# Patient Record
Sex: Male | Born: 1995 | Race: White | Hispanic: No | Marital: Single | Smoking: Current every day smoker
Health system: Southern US, Community
[De-identification: ages and names within clinical notes are randomized; demographics above are authoritative.]

## PROBLEM LIST (undated history)

## (undated) DIAGNOSIS — R569 Unspecified convulsions: Secondary | ICD-10-CM

---

## 2014-11-25 ENCOUNTER — Encounter (HOSPITAL_COMMUNITY): Payer: Self-pay | Admitting: Emergency Medicine

## 2014-11-25 ENCOUNTER — Emergency Department (HOSPITAL_COMMUNITY)
Admission: EM | Admit: 2014-11-25 | Discharge: 2014-11-25 | Disposition: A | Discharge status: Home / Self Care | Payer: Medicaid Other | Attending: Emergency Medicine | Admitting: Emergency Medicine

## 2014-11-25 DIAGNOSIS — J069 Acute upper respiratory infection, unspecified: Secondary | ICD-10-CM | POA: Diagnosis not present

## 2014-11-25 DIAGNOSIS — R110 Nausea: Secondary | ICD-10-CM | POA: Insufficient documentation

## 2014-11-25 DIAGNOSIS — B9789 Other viral agents as the cause of diseases classified elsewhere: Secondary | ICD-10-CM

## 2014-11-25 DIAGNOSIS — R0981 Nasal congestion: Secondary | ICD-10-CM | POA: Diagnosis present

## 2014-11-25 MED ORDER — ALBUTEROL SULFATE HFA 108 (90 BASE) MCG/ACT IN AERS: 1.0000 | INHALATION_SPRAY | Freq: Four times a day (QID) | RESPIRATORY_TRACT | Status: AC | PRN

## 2014-11-25 MED ORDER — BENZONATATE 100 MG PO CAPS: 100.0000 mg | ORAL_CAPSULE | Freq: Three times a day (TID) | ORAL | Status: AC

## 2014-11-25 MED ORDER — ONDANSETRON HCL 4 MG PO TABS: 4.0000 mg | ORAL_TABLET | Freq: Four times a day (QID) | ORAL | Status: AC

## 2014-11-25 MED ORDER — FLUTICASONE PROPIONATE 50 MCG/ACT NA SUSP: 2.0000 | Freq: Every day | NASAL | Status: AC

## 2014-11-25 MED ORDER — CETIRIZINE HCL 10 MG PO CAPS: 10.0000 mg | ORAL_CAPSULE | Freq: Every day | ORAL | Status: AC

## 2014-11-25 NOTE — ED Notes (Signed)
Pt reports nasal congestion and productive cough x 3 days. Denies fevers/chills.

## 2014-11-25 NOTE — Discharge Instructions (Signed)
Upper Respiratory Infection, Adult °An upper respiratory infection (URI) is also sometimes known as the common cold. The upper respiratory tract includes the nose, sinuses, throat, trachea, and bronchi. Bronchi are the airways leading to the lungs. Most people improve within 1 week, but symptoms can last up to 2 weeks. A residual cough may last even longer.  °CAUSES °Many different viruses can infect the tissues lining the upper respiratory tract. The tissues become irritated and inflamed and often become very moist. Mucus production is also common. A cold is contagious. You can easily spread the virus to others by oral contact. This includes kissing, sharing a glass, coughing, or sneezing. Touching your mouth or nose and then touching a surface, which is then touched by another person, can also spread the virus. °SYMPTOMS  °Symptoms typically develop 1 to 3 days after you come in contact with a cold virus. Symptoms vary from person to person. They may include: °· Runny nose. °· Sneezing. °· Nasal congestion. °· Sinus irritation. °· Sore throat. °· Loss of voice (laryngitis). °· Cough. °· Fatigue. °· Muscle aches. °· Loss of appetite. °· Headache. °· Low-grade fever. °DIAGNOSIS  °You might diagnose your own cold based on familiar symptoms, since most people get a cold 2 to 3 times a year. Your caregiver can confirm this based on your exam. Most importantly, your caregiver can check that your symptoms are not due to another disease such as strep throat, sinusitis, pneumonia, asthma, or epiglottitis. Blood tests, throat tests, and X-rays are not necessary to diagnose a common cold, but they may sometimes be helpful in excluding other more serious diseases. Your caregiver will decide if any further tests are required. °RISKS AND COMPLICATIONS  °You may be at risk for a more severe case of the common cold if you smoke cigarettes, have chronic heart disease (such as heart failure) or lung disease (such as asthma), or if  you have a weakened immune system. The very young and very old are also at risk for more serious infections. Bacterial sinusitis, middle ear infections, and bacterial pneumonia can complicate the common cold. The common cold can worsen asthma and chronic obstructive pulmonary disease (COPD). Sometimes, these complications can require emergency medical care and may be life-threatening. °PREVENTION  °The best way to protect against getting a cold is to practice good hygiene. Avoid oral or hand contact with people with cold symptoms. Wash your hands often if contact occurs. There is no clear evidence that vitamin C, vitamin E, echinacea, or exercise reduces the chance of developing a cold. However, it is always recommended to get plenty of rest and practice good nutrition. °TREATMENT  °Treatment is directed at relieving symptoms. There is no cure. Antibiotics are not effective, because the infection is caused by a virus, not by bacteria. Treatment may include: °· Increased fluid intake. Sports drinks offer valuable electrolytes, sugars, and fluids. °· Breathing heated mist or steam (vaporizer or shower). °· Eating chicken soup or other clear broths, and maintaining good nutrition. °· Getting plenty of rest. °· Using gargles or lozenges for comfort. °· Controlling fevers with ibuprofen or acetaminophen as directed by your caregiver. °· Increasing usage of your inhaler if you have asthma. °Zinc gel and zinc lozenges, taken in the first 24 hours of the common cold, can shorten the duration and lessen the severity of symptoms. Pain medicines may help with fever, muscle aches, and throat pain. A variety of non-prescription medicines are available to treat congestion and runny nose. Your caregiver   can make recommendations and may suggest nasal or lung inhalers for other symptoms.  °HOME CARE INSTRUCTIONS  °· Only take over-the-counter or prescription medicines for pain, discomfort, or fever as directed by your  caregiver. °· Use a warm mist humidifier or inhale steam from a shower to increase air moisture. This may keep secretions moist and make it easier to breathe. °· Drink enough water and fluids to keep your urine clear or pale yellow. °· Rest as needed. °· Return to work when your temperature has returned to normal or as your caregiver advises. You may need to stay home longer to avoid infecting others. You can also use a face mask and careful hand washing to prevent spread of the virus. °SEEK MEDICAL CARE IF:  °· After the first few days, you feel you are getting worse rather than better. °· You need your caregiver's advice about medicines to control symptoms. °· You develop chills, worsening shortness of breath, or brown or red sputum. These may be signs of pneumonia. °· You develop yellow or brown nasal discharge or pain in the face, especially when you bend forward. These may be signs of sinusitis. °· You develop a fever, swollen neck glands, pain with swallowing, or white areas in the back of your throat. These may be signs of strep throat. °SEEK IMMEDIATE MEDICAL CARE IF:  °· You have a fever. °· You develop severe or persistent headache, ear pain, sinus pain, or chest pain. °· You develop wheezing, a prolonged cough, cough up blood, or have a change in your usual mucus (if you have chronic lung disease). °· You develop sore muscles or a stiff neck. °Document Released: 03/27/2001 Document Revised: 12/24/2011 Document Reviewed: 01/06/2014 °ExitCare® Patient Information ©2015 [employer]. This information is not intended to replace advice given to you by your health care provider. Make sure you discuss any questions you have with your health care provider. ° °Emergency Department Resource Guide °1) Find a Doctor and Pay Out of Pocket °Although you won't have to find out who is covered by your insurance plan, it is a good idea to ask around and get recommendations. You will then need to call the office and see if  the doctor you have chosen will accept you as a new patient and what types of options they offer for patients who are self-pay. Some doctors offer discounts or will set up payment plans for their patients who do not have insurance, but you will need to ask so you aren't surprised when you get to your appointment. ° °2) Contact Your Local Health Department °Not all health departments have doctors that can see patients for sick visits, but many do, so it is worth a call to see if yours does. If you don't know where your local health department is, you can check in your phone book. The CDC also has a tool to help you locate your state's health department, and many state websites also have listings of all of their local health departments. ° °3) Find a Walk-in Clinic °If your illness is not likely to be very severe or complicated, you may want to try a walk in clinic. These are popping up all over the country in pharmacies, drugstores, and shopping centers. They're usually staffed by nurse practitioners or physician assistants that have been trained to treat common illnesses and complaints. They're usually fairly quick and inexpensive. However, if you have serious medical issues or chronic medical problems, these are probably not your best option. ° °  No Primary Care Doctor: °- Call [facility] at  832-8000 - they can help you locate a primary care doctor that  accepts your insurance, provides certain services, etc. °- Physician Referral Service- 1-800-533-3463 ° °Chronic Pain Problems: °Organization         Address  Phone   Notes  °[facility]  (336) 297-2271 Patients need to be referred by their primary care doctor.  ° °Medication Assistance: °Organization         Address  Phone   Notes  °[place] [facility] [address] °[place], [place] [address] (336) 641-8030 --Must be a resident of [place] °-- Must have NO insurance coverage whatsoever (no  Medicaid/ Medicare, etc.) °-- The pt. MUST have a primary care doctor that directs their care regularly and follows them in the community °  °[facility]  (866) 331-1348   °[employer]  (888) 892-1162   ° °Agencies that provide inexpensive medical care: °Organization         Address  Phone   Notes  °[facility]  (336) 832-8035   °[facility]    (336) 832-7272   °[facility] [address] °[place], [place] [address] (336) 832-4777   °[facility] [address], °[place] (336) 271-4999   °[facility]    (336) 373-0678   °[facility]    (336) 272-1050   °[facility] ° [address], [place] Phone:  (336) 832-4444, Fax:  (336) 832-4440 Hours of Operation:  9 am - 6 pm, M-F.  Also accepts Medicaid/Medicare and self-pay.  °[facility] ° [address], [place] Phone: (336) 832-3150, Fax: (336) 832-3151. Hours of Operation:  8:30 am - 5:30 pm, M-F.  Also accepts Medicaid and self-pay.  °[facility] [place] [address], [place] Phone: (336) 878-6027   °[facility] [address], [place], [place] (336)723-1848, Ext. 123 Mondays & Thursdays: 7-9 AM.  First 15 patients are seen on a first come, first serve basis. °  ° °Medicaid-accepting [place] Providers: ° °Organization         Address  Phone   Notes  °[facility] [address], [place] (336) 641-2100 Also accepts self-pay patients.  °[facility] [address], [place] ° (336) 856-9996   °[facility] [address], [place] (336) 288-8857   °[facility] [address] [place] [address], [place] (336) 299-7000   °Veita Bland [address], [place]  ° (336) 373-1557 Only accepts [place] Access Medicaid patients after they have their name applied to their card.   ° °Self-Pay (no insurance) in [place]: ° °Organization         Address  Phone   Notes  °Sickle Cell Patients, [facility] [address], [place] (336) 832-1970   °[facility] [address], [place] (336) 832-4400   °[facility] ° [address],  (336) 992-4800   °[facility]/Dr. Osei-Bonsu ° [address] [place] [address], [place] or [address], [place] (336) 841-8500 Phone number for both [place] and [place] locations is the same.  °[facility] [address] [place] [address], [place] (336) 299-0000   °  [facility] [address] [place] [address], [place] or [address] (336) 852-7530 °(336) 878-2260   °[facility] [address], [place] (336) 350-1642, phone; (336) 294-5005, fax Sees patients 1st and 3rd Saturday of every month.  Must not qualify for public or private insurance (i.e. Medicaid, Medicare, [place] Health Choice, Veterans' Benefits) • Household income should be no more than 200% of the poverty level •The clinic cannot treat you if you are pregnant or think you are pregnant • Sexually transmitted diseases are not treated at the clinic.  ° ° °Dental Care: °Organization         Address  Phone  Notes  °[facility] [address], [place] (336) 641-6152 Accepts children up to age 21 who are enrolled in Medicaid or [place] Health Choice; pregnant women with a Medicaid card; and children who have applied for Medicaid or [place] Health Choice, but were declined, whose parents can pay a reduced fee at time of service.  °[facility] [place]  [address], [place] (336) 641-7733 Accepts children up to age 21 who are enrolled in Medicaid or [place] Health Choice; pregnant women with a Medicaid card; and children who have applied for Medicaid or [place] Health Choice,  but were declined, whose parents can pay a reduced fee at time of service.  °[facility] ° [address], [place] (336) 641-4533 Patients are seen by appointment only. Walk-ins are not accepted. [facility] will see patients 18 years of age and older. °Monday - Tuesday (8am-5pm) °Most Wednesdays (8:30-5pm) °$30 per visit, cash only  °[facility] ° [address], [place] (336) 641-4533 Patients are seen by appointment only. Walk-ins are not accepted. [facility] will see patients 18 years of age and older. °One Wednesday Evening (Monthly: Volunteer Based).  $30 per visit, cash only  °[facility]  (919) 537-3737 for adults; Children under age 4, call Graduate Pediatric Dentistry at (919) [phone]. Children aged 4-14, please call (919) [phone] to request a pediatric application. ° Dental services are provided in all areas of dental care including fillings, crowns and bridges, complete and partial dentures, implants, gum treatment, root canals, and extractions. Preventive care is also provided. Treatment is provided to both adults and children. °Patients are selected via a lottery and there is often a waiting list. °  °[facility] [address], °[place] ° (336) 763-8833 [email or web address] °  °[facility] [address], [place], [place] (336)723-1848, Ext. 123 Second and Fourth Thursday of each month, opens at 6:30 AM; Clinic ends at 9 AM.  Patients are seen on a first-come first-served basis, and a limited number are seen during each clinic.  ° °[facility] ° [address], [place], [place] (336) 723-7904   Eligibility Requirements °You must have lived in [place], [place], or [place] counties for at least the last three months. °  You cannot be eligible for state or federal sponsored healthcare insurance, including Veterans Administration, Medicaid, or Medicare. °  You generally  cannot be eligible for healthcare insurance through your employer.  °  How to apply: °Eligibility screenings are held every Tuesday and Wednesday afternoon from 1:00 pm until 4:00 pm. You do not need an appointment for the interview!  °[facility] [address], [place], [place] 336-631-2330   °  [place] [facility]  336-342-8273   °[facility]  336-703-3100   °[facility]  336-570-6415   ° °Behavioral Health Resources in the Community: °Intensive Outpatient Programs °Organization         Address  Phone  Notes  °[facility] [address], [place], [place] 336-878-6098   °[facility] [address], [place], [place] 336-832-9800   °[facility] [address], [place], [place] ° 336-882-2125   °[facility] [address],  °[place], [place] 1-800-853-5163 or 336-641-4981   °Substance Abuse Resources °Organization         Address  Phone  Notes  °[facility]  336-882-2125   °[facility]  336-784-9470   °[facility]  336-285-9073   °[facility]  336-845-3988   °Residential & Outpatient Substance Abuse Program  1-800-659-3381   °Psychological Services °Organization         Address  Phone  Notes  °[facility]  336- 832-9600   °[facility]  336- 378-7881   °[facility] [address], [place] 1-800-853-5163 or 336-641-4981   ° °Mobile Crisis Teams °Organization         Address  Phone  Notes  °[facility], Mobile Crisis Care Unit  1-877-626-1772   °Assertive °[facility] ° [address] [place], [place] 336-834-9664   °Sharon DeEsch [address] °[place] 336-554-5454   ° °Self-Help/Support Groups °Organization         Address  Phone             Notes  °[facility] - variety of support groups  336- 373-1402 Call for more  information  °Narcotics Anonymous (NA), [facility] [address], °[place]  2 meetings at this location  ° °Residential Treatment Programs °Organization         Address  Phone  Notes  °[facility] [address],    °[place]  1-866-801-8205   °[facility] ° [address], [place], [place] 704-293-8524   °[facility] [address], [place] 336-845-3988 Admissions: 8am-3pm M-F  °[facility] [address]    °[place], [place] 336-841-1104   °[facility] [address], [place], [place] 336-379-7146   °[facility] [address]  °[place], [place] 336-285-9073   °[facility] Intensive Outpatient [address], [place], [place] 336-852-3033   °[facility] [address]  °[place], [place] 1-877-615-2722 or 336-784-9470   °[facility] [address] [place], [place] 336-227-7417 Accepts Medicaid  °[facility] [address]  ° [place] 1-800-659-3381 Substance Abuse/Addiction Treatment  ° °[place] Behavioral Health Resources °Organization         Address  Phone  Notes  °[facility]  (888) 581-9988   °Julie Brannon, PhD [address] [place], [place]   (336) 349-5553 or (336) 951-0000   °[facility]   [address] °[place], [place] (336) 349-4454   °[facility] [address], [place],  (336) 342-8316 Insurance/Medicaid/sponsorship through [facility]  °[facility] [address]                                      [place], [place] (336) 342-8316 Therapy/tele-psych/case  °[facility] [address]  ° [place], [place] (336) 349-2233    °Dr. Arfeen  (336) 349-4544   °[facility] [place]  [employer] [facility] [place] [facility] 1) [address],  °2) [address], [place] °3)  [address], [place] (336)  349-3220 °(336) 342-7768 ° °(336) 342-8140   °[place] [facility] (336) 342-1394 or (336) 342-3537 (After Hours)    ° ° °

## 2014-11-25 NOTE — ED Provider Notes (Signed)
CSN: [id number]     Arrival date & time 11/25/14  1612 History  This chart was scribed for Eben Burowourtney A Forcucci, PA-C working with Ethelda ChickMartha K Linker, MD by Evon Slackerrance Branch, ED Scribe. This patient was seen in room TR11C/TR11C and the patient's care was started at 5:23 PM.      Chief Complaint  Patient presents with  . Nasal Congestion  . Cough   The history is provided by the patient. No language interpreter was used.   HPI Comments: Johnny [name] is a 19 y.o. male who presents to the Emergency Department complaining of new clear productive cough onset 3 days prior. Pt states that he has associated rhinorrhea and sore throat. Pt states that he has some nausea as well. Pt states that he has tried Robitussin and Thera-flu with no relief. Pt denies any recent sick contacts. Pt denies fever, chills, vomiting, diarrhea, ear pain, SOB or trouble swallowing.   History reviewed. No pertinent past medical history. History reviewed. No pertinent past surgical history. No family history on file. History  Substance Use Topics  . Smoking status: Never Smoker   . Smokeless tobacco: Not on file  . Alcohol Use: No    Review of Systems  Constitutional: Negative for fever and chills.  HENT: Positive for rhinorrhea and sore throat. Negative for ear pain and trouble swallowing.   Respiratory: Positive for cough. Negative for shortness of breath.   Gastrointestinal: Positive for nausea. Negative for vomiting and diarrhea.  All other systems reviewed and are negative.     Allergies  Review of patient's allergies indicates no known allergies.  Home Medications   Prior to Admission medications   Medication Sig Start Date End Date Taking? Authorizing Provider  albuterol (PROVENTIL HFA;VENTOLIN HFA) 108 (90 BASE) MCG/ACT inhaler Inhale 1-2 puffs into the lungs every 6 (six) hours as needed for wheezing or shortness of breath. 11/25/14   Charliene Inoue A Forcucci, PA-C  benzonatate (TESSALON) 100 MG capsule  Take 1 capsule (100 mg total) by mouth every 8 (eight) hours. 11/25/14   Sheniece Ruggles A Forcucci, PA-C  Cetirizine HCl 10 MG CAPS Take 1 capsule (10 mg total) by mouth at bedtime. 11/25/14   Camdon Saetern A Forcucci, PA-C  fluticasone (FLONASE) 50 MCG/ACT nasal spray Place 2 sprays into both nostrils daily. 11/25/14   Toben Acuna A Forcucci, PA-C  ondansetron (ZOFRAN) 4 MG tablet Take 1 tablet (4 mg total) by mouth every 6 (six) hours. 11/25/14   Altonio Schwertner A Forcucci, PA-C   BP 164/79 mmHg  Pulse 100  Temp(Src) 98.1 F (36.7 C) (Oral)  Resp 16  Ht 6\' 4"  (1.93 m)  Wt 230 lb (104.327 kg)  BMI 28.01 kg/m2  SpO2 100%   Physical Exam  Constitutional: He is oriented to person, place, and time. He appears well-developed and well-nourished. No distress.  HENT:  Head: Normocephalic and atraumatic.  Nose: Mucosal edema present.  Mouth/Throat: Uvula is midline, oropharynx is clear and moist and mucous membranes are normal. No trismus in the jaw. No oropharyngeal exudate.  Eyes: Conjunctivae and EOM are normal. Pupils are equal, round, and reactive to light. No scleral icterus.  Neck: Normal range of motion. Neck supple. No JVD present. No thyromegaly present.  Cardiovascular: Normal rate, regular rhythm, normal heart sounds and intact distal pulses.  Exam reveals no gallop and no friction rub.   No murmur heard. Pulmonary/Chest: Effort normal and breath sounds normal. No respiratory distress. He has no wheezes. He has no rales. He exhibits no tenderness.  Musculoskeletal: Normal range of motion.  Lymphadenopathy:    He has no cervical adenopathy.  Neurological: He is alert and oriented to person, place, and time.  Skin: Skin is warm and dry.  Psychiatric: He has a normal mood and affect. His behavior is normal. Judgment and thought content normal.  Nursing note and vitals reviewed.   ED Course  Procedures (including critical care time) DIAGNOSTIC STUDIES: Oxygen Saturation is 100% on RA, normal by my  interpretation.    COORDINATION OF CARE: 5:47 PM-Discussed treatment plan with pt at bedside and pt agreed to plan.     Labs Review Labs Reviewed - No data to display  Imaging Review No results found.   EKG Interpretation None      MDM   Final diagnoses:  Viral URI with cough   Patient's 19 year old male who presents emergency room for evaluation of nasal congestion and coughing. Physical exam reveals clear lung sounds. Patient has stable vital signs. He does have significant mucosal edema of the nose. Suspect that this is likely a viral upper respiratory infection. Given that there has been only 3 days of symptoms doubt that this is pneumonia at this time. We'll discharge home with symptomatic treatment including Flonase, nasal saline, Zyrtec, Tessalon, and albuterol. We'll also give Zofran for nausea. Patient can return to school. We have discussed return precautions including worsening shortness of breath, cough that is productive of sputum, or intractable fevers. Father and son state understanding and agreement at this time. Patient stable for discharge.  I personally performed the services described in this documentation, which was scribed in my presence. The recorded information has been reviewed and is accurate.      Eben Burow, PA-C 11/25/14 1759  Ethelda Chick, MD 11/25/14 [phone]

## 2014-11-25 NOTE — ED Notes (Addendum)
Pt c/o runny nose and productive cough for the past 3 days, has taken robitussin at home, denies sore throat, n/v/d, fever or chills.

## 2019-02-07 ENCOUNTER — Emergency Department (HOSPITAL_COMMUNITY)
Admission: EM | Admit: 2019-02-07 | Discharge: 2019-02-07 | Disposition: A | Discharge status: Home / Self Care | Payer: Self-pay | Attending: Emergency Medicine | Admitting: Emergency Medicine

## 2019-02-07 ENCOUNTER — Emergency Department (HOSPITAL_COMMUNITY): Payer: Self-pay

## 2019-02-07 ENCOUNTER — Encounter (HOSPITAL_COMMUNITY): Payer: Self-pay

## 2019-02-07 DIAGNOSIS — F17210 Nicotine dependence, cigarettes, uncomplicated: Secondary | ICD-10-CM | POA: Insufficient documentation

## 2019-02-07 DIAGNOSIS — F1290 Cannabis use, unspecified, uncomplicated: Secondary | ICD-10-CM | POA: Insufficient documentation

## 2019-02-07 DIAGNOSIS — R05 Cough: Secondary | ICD-10-CM | POA: Insufficient documentation

## 2019-02-07 DIAGNOSIS — J069 Acute upper respiratory infection, unspecified: Secondary | ICD-10-CM | POA: Insufficient documentation

## 2019-02-07 DIAGNOSIS — B9789 Other viral agents as the cause of diseases classified elsewhere: Secondary | ICD-10-CM

## 2019-02-07 DIAGNOSIS — R000 Tachycardia, unspecified: Secondary | ICD-10-CM | POA: Insufficient documentation

## 2019-02-07 DIAGNOSIS — R059 Cough, unspecified: Secondary | ICD-10-CM

## 2019-02-07 DIAGNOSIS — Z79899 Other long term (current) drug therapy: Secondary | ICD-10-CM | POA: Insufficient documentation

## 2019-02-07 DIAGNOSIS — R0602 Shortness of breath: Secondary | ICD-10-CM | POA: Insufficient documentation

## 2019-02-07 HISTORY — DX: Unspecified convulsions: R56.9

## 2019-02-07 LAB — RAPID URINE DRUG SCREEN, HOSP PERFORMED
Amphetamines: NOT DETECTED
Barbiturates: NOT DETECTED
Benzodiazepines: NOT DETECTED
Cocaine: NOT DETECTED
Opiates: NOT DETECTED
Tetrahydrocannabinol: POSITIVE — AB

## 2019-02-07 LAB — CBC WITH DIFFERENTIAL/PLATELET
Abs Immature Granulocytes: 0.02 10*3/uL (ref 0.00–0.07)
Basophils Absolute: 0 10*3/uL (ref 0.0–0.1)
Basophils Relative: 1 %
Eosinophils Absolute: 0.3 10*3/uL (ref 0.0–0.5)
Eosinophils Relative: 4 %
HCT: 44.3 % (ref 39.0–52.0)
Hemoglobin: 14.9 g/dL (ref 13.0–17.0)
Immature Granulocytes: 0 %
Lymphocytes Relative: 44 %
Lymphs Abs: 3.1 10*3/uL (ref 0.7–4.0)
MCH: 29.9 pg (ref 26.0–34.0)
MCHC: 33.6 g/dL (ref 30.0–36.0)
MCV: 89 fL (ref 80.0–100.0)
Monocytes Absolute: 0.4 10*3/uL (ref 0.1–1.0)
Monocytes Relative: 6 %
Neutro Abs: 3.2 10*3/uL (ref 1.7–7.7)
Neutrophils Relative %: 45 %
Platelets: 340 10*3/uL (ref 150–400)
RBC: 4.98 MIL/uL (ref 4.22–5.81)
RDW: 12.5 % (ref 11.5–15.5)
WBC: 7.1 10*3/uL (ref 4.0–10.5)
nRBC: 0 % (ref 0.0–0.2)

## 2019-02-07 LAB — BASIC METABOLIC PANEL
Anion gap: 13 (ref 5–15)
BUN: 12 mg/dL (ref 6–20)
CO2: 23 mmol/L (ref 22–32)
Calcium: 9.9 mg/dL (ref 8.9–10.3)
Chloride: 107 mmol/L (ref 98–111)
Creatinine, Ser: 0.95 mg/dL (ref 0.61–1.24)
GFR calc Af Amer: >60 mL/min (ref >60)
GFR calc non Af Amer: >60 mL/min (ref >60)
Glucose, Bld: 94 mg/dL (ref 70–99)
Potassium: 3.6 mmol/L (ref 3.5–5.1)
Sodium: 143 mmol/L (ref 135–145)

## 2019-02-07 LAB — ETHANOL: Alcohol, Ethyl (B): 149 mg/dL — ABNORMAL HIGH (ref <10)

## 2019-02-07 LAB — TROPONIN I: Troponin I: <0.03 ng/mL (ref <0.03)

## 2019-02-07 LAB — D-DIMER, QUANTITATIVE: D-Dimer, Quant: 0.29 ug/mL-FEU (ref 0.00–0.50)

## 2019-02-07 NOTE — ED Notes (Signed)
Patient verbalizes understanding of discharge instructions. Opportunity for questioning and answers were provided. Pt discharged from ED. 
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
## 2019-02-07 NOTE — ED Notes (Signed)
Patient transported to X-ray 
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
## 2019-02-07 NOTE — ED Notes (Signed)
ED Provider at bedside. 

## 2019-02-07 NOTE — Discharge Instructions (Addendum)
You will need to practice self isolation until your symptoms have cleared.  Return here for any worsening in your condition.  Follow-up with a primary doctor.  You can use over-the-counter cough and cold medications.

## 2019-04-26 ENCOUNTER — Other Ambulatory Visit: Payer: Self-pay

## 2019-04-26 ENCOUNTER — Encounter (HOSPITAL_COMMUNITY): Payer: Self-pay | Admitting: Emergency Medicine

## 2019-04-26 ENCOUNTER — Emergency Department (HOSPITAL_COMMUNITY)
Admission: EM | Admit: 2019-04-26 | Discharge: 2019-04-26 | Disposition: A | Discharge status: Home / Self Care | Payer: Self-pay | Attending: Emergency Medicine | Admitting: Emergency Medicine

## 2019-04-26 DIAGNOSIS — F17210 Nicotine dependence, cigarettes, uncomplicated: Secondary | ICD-10-CM | POA: Insufficient documentation

## 2019-04-26 DIAGNOSIS — F10921 Alcohol use, unspecified with intoxication delirium: Secondary | ICD-10-CM

## 2019-04-26 DIAGNOSIS — R569 Unspecified convulsions: Secondary | ICD-10-CM | POA: Insufficient documentation

## 2019-04-26 DIAGNOSIS — Y906 Blood alcohol level of 120-199 mg/100 ml: Secondary | ICD-10-CM | POA: Insufficient documentation

## 2019-04-26 DIAGNOSIS — R0602 Shortness of breath: Secondary | ICD-10-CM | POA: Insufficient documentation

## 2019-04-26 DIAGNOSIS — F10988 Alcohol use, unspecified with other alcohol-induced disorder: Secondary | ICD-10-CM | POA: Insufficient documentation

## 2019-04-26 LAB — ETHANOL: Alcohol, Ethyl (B): 175 mg/dL — ABNORMAL HIGH (ref <10)

## 2019-04-26 LAB — CBC
HCT: 44.3 % (ref 39.0–52.0)
Hemoglobin: 15.1 g/dL (ref 13.0–17.0)
MCH: 30 pg (ref 26.0–34.0)
MCHC: 34.1 g/dL (ref 30.0–36.0)
MCV: 87.9 fL (ref 80.0–100.0)
Platelets: 290 10*3/uL (ref 150–400)
RBC: 5.04 MIL/uL (ref 4.22–5.81)
RDW: 11.8 % (ref 11.5–15.5)
WBC: 9.2 10*3/uL (ref 4.0–10.5)
nRBC: 0 % (ref 0.0–0.2)

## 2019-04-26 LAB — COMPREHENSIVE METABOLIC PANEL
ALT: 32 U/L (ref 0–44)
AST: 24 U/L (ref 15–41)
Albumin: 4.4 g/dL (ref 3.5–5.0)
Alkaline Phosphatase: 90 U/L (ref 38–126)
Anion gap: 11 (ref 5–15)
BUN: 12 mg/dL (ref 6–20)
CO2: 22 mmol/L (ref 22–32)
Calcium: 9.5 mg/dL (ref 8.9–10.3)
Chloride: 109 mmol/L (ref 98–111)
Creatinine, Ser: 0.97 mg/dL (ref 0.61–1.24)
GFR calc Af Amer: >60 mL/min (ref >60)
GFR calc non Af Amer: >60 mL/min (ref >60)
Glucose, Bld: 110 mg/dL — ABNORMAL HIGH (ref 70–99)
Potassium: 4.1 mmol/L (ref 3.5–5.1)
Sodium: 142 mmol/L (ref 135–145)
Total Bilirubin: 0.7 mg/dL (ref 0.3–1.2)
Total Protein: 7.7 g/dL (ref 6.5–8.1)

## 2019-04-26 MED ORDER — SODIUM CHLORIDE 0.9 % IV BOLUS
1000.0000 mL | Freq: Once | INTRAVENOUS | Status: AC
  Administered 2019-04-26: 1000 mL via INTRAVENOUS

## 2019-04-26 MED ORDER — ONDANSETRON HCL 4 MG/2ML IJ SOLN
4.0000 mg | Freq: Once | INTRAMUSCULAR | Status: AC
  Administered 2019-04-26: 21:00:00 4 mg via INTRAVENOUS
  Filled 2019-04-26: qty 2

## 2019-04-26 NOTE — ED Provider Notes (Signed)
[facility] EMERGENCY DEPARTMENT Provider Note   CSN: [id number] Arrival date & time: 04/26/19  1930     History   Chief Complaint Chief Complaint  Patient presents with  . Alcohol Intoxication  . Shortness of Breath    HPI Johnny [name] is a 23 y.o. male.     HPI Patient presents With concern of dyspnea. He notes that he currently has no wheezing, but it was when he was at home, after drinking substantial amounts of liquor earlier in the day he felt short of breath, possibly had wheezing. He has no diagnosis of asthma, states that there is currently no wheezing either. No pain, he does feel generally weak, possibly dehydrated. He denies medical problems, denies taking medication, acknowledges drinking alcohol in copious amounts.  History is somewhat limited by the patient's clinical alcohol intoxication, but he does not seemingly describe his current situation appropriately when asked specific questions, such as whether he is in pain or not.    Past Medical History:  Diagnosis Date  . Seizures (HCC)     There are no active problems to display for this patient.   History reviewed. No pertinent surgical history.      Home Medications    Prior to Admission medications   Medication Sig Start Date End Date Taking? Authorizing Provider  albuterol (PROVENTIL HFA;VENTOLIN HFA) 108 (90 BASE) MCG/ACT inhaler Inhale 1-2 puffs into the lungs every 6 (six) hours as needed for wheezing or shortness of breath. 11/25/14   Shirleen Schirmerllis, Courtney, PA-C  benzonatate (TESSALON) 100 MG capsule Take 1 capsule (100 mg total) by mouth every 8 (eight) hours. Patient not taking: Reported on 02/07/2019 11/25/14   Shirleen Schirmerllis, Courtney, PA-C  Cetirizine HCl 10 MG CAPS Take 1 capsule (10 mg total) by mouth at bedtime. Patient not taking: Reported on 02/07/2019 11/25/14   Shirleen Schirmerllis, Courtney, PA-C  fluticasone [facility](FLONASE) 50 MCG/ACT nasal spray Place 2 sprays into both nostrils daily. Patient  not taking: Reported on 02/07/2019 11/25/14   Shirleen Schirmerllis, Courtney, PA-C  ondansetron (ZOFRAN) 4 MG tablet Take 1 tablet (4 mg total) by mouth every 6 (six) hours. Patient not taking: Reported on 02/07/2019 11/25/14   Shirleen Schirmerllis, Courtney, PA-C    Family History No family history on file.  Social History Social History   Tobacco Use  . Smoking status: Current Every Day Smoker    Packs/day: 0.50    Types: Cigarettes  Substance Use Topics  . Alcohol use: Yes  . Drug use: No     Allergies   Patient has no known allergies.   Review of Systems Review of Systems  Unable to perform ROS: Other  Alcohol intoxication   Physical Exam Updated Vital Signs Pulse 78   Temp 98 F (36.7 C) (Oral)   Resp 17   SpO2 100%   Physical Exam Vitals signs and nursing note reviewed.  Constitutional:      General: He is not in acute distress.    Appearance: He is well-developed.  HENT:     Head: Normocephalic and atraumatic.  Eyes:     Conjunctiva/sclera: Conjunctivae normal.  Cardiovascular:     Rate and Rhythm: Normal rate and regular rhythm.  Pulmonary:     Effort: Pulmonary effort is normal. No respiratory distress.     Breath sounds: No stridor. No wheezing.  Abdominal:     General: There is no distension.  Skin:    General: Skin is warm and dry.  Neurological:     Mental  Status: He is alert and oriented to person, place, and time.      ED Treatments / Results  Labs (all labs ordered are listed, but only abnormal results are displayed) Labs Reviewed  ETHANOL - Abnormal; Notable for the following components:      Result Value   Alcohol, Ethyl (B) 175 (*)    All other components within normal limits  COMPREHENSIVE METABOLIC PANEL - Abnormal; Notable for the following components:   Glucose, Bld 110 (*)    All other components within normal limits  CBC    EKG EKG Interpretation  Date/Time:  Sunday April 26 2019 19:40:57 EDT Ventricular Rate:  80 PR Interval:    QRS Duration:  114 QT Interval:  379 QTC Calculation: 438 R Axis:   81 Text Interpretation:  Sinus rhythm Probable left atrial enlargement Incomplete right bundle branch block Abnormal ECG Confirmed by Carmin Muskrat [phone]) on 04/26/2019 8:01:06 PM   Procedures Procedures (including critical care time)  Medications Ordered in ED Medications  sodium chloride 0.9 % bolus 1,000 mL (1,000 mLs Intravenous New Bag/Given 04/26/19 2111)  ondansetron (ZOFRAN) injection 4 mg (4 mg Intravenous Given 04/26/19 2111)     Initial Impression / Assessment and Plan / ED Course  I have reviewed the triage vital signs and the nursing notes.  Pertinent labs & imaging results that were available during my care of the patient were reviewed by me and considered in my medical decision making (see chart for details).  11:17 PM Patient awakens easily, is in no distress, breathing is nonlabored, there is no hypoxia.  This young male presents with apparent acute alcohol intoxication, no evidence for trauma, no evidence for decompensation. Patient has metabolized for several hours, awakens easily, has no new complaints. Patient awaiting a ride home, appropriate for discharge.  Final Clinical Impressions(s) / ED Diagnoses   Final diagnoses:  Acute alcoholic intoxication with delirium [facility])     Carmin Muskrat, MD 04/26/19 2319

## 2019-04-26 NOTE — ED Triage Notes (Signed)
Pt arrived [facility] after drinking 1/5 whiskey, vomiting once, and having SOB. VSS BP 132/76 P 58 O2 98%

## 2019-04-26 NOTE — ED Notes (Signed)
All appropriate discharge materials reviewed with patient at length. Time for questions provided. Pt denies any further questions at this time. Verbalizes understanding of all provided materials.  

## 2019-06-14 ENCOUNTER — Emergency Department (HOSPITAL_COMMUNITY)
Admission: EM | Admit: 2019-06-14 | Discharge: 2019-06-14 | Disposition: A | Discharge status: Home / Self Care | Payer: Self-pay | Attending: Emergency Medicine | Admitting: Emergency Medicine

## 2019-06-14 ENCOUNTER — Emergency Department (HOSPITAL_COMMUNITY): Payer: Self-pay

## 2019-06-14 ENCOUNTER — Other Ambulatory Visit: Payer: Self-pay

## 2019-06-14 DIAGNOSIS — R0602 Shortness of breath: Secondary | ICD-10-CM

## 2019-06-14 DIAGNOSIS — F17210 Nicotine dependence, cigarettes, uncomplicated: Secondary | ICD-10-CM | POA: Insufficient documentation

## 2019-06-14 DIAGNOSIS — N50811 Right testicular pain: Secondary | ICD-10-CM | POA: Insufficient documentation

## 2019-06-14 DIAGNOSIS — N50819 Testicular pain, unspecified: Secondary | ICD-10-CM

## 2019-06-14 DIAGNOSIS — Z79899 Other long term (current) drug therapy: Secondary | ICD-10-CM | POA: Insufficient documentation

## 2019-06-14 LAB — CBC WITH DIFFERENTIAL/PLATELET
Abs Immature Granulocytes: 0.01 10*3/uL (ref 0.00–0.07)
Basophils Absolute: 0 10*3/uL (ref 0.0–0.1)
Basophils Relative: 1 %
Eosinophils Absolute: 0.4 10*3/uL (ref 0.0–0.5)
Eosinophils Relative: 5 %
HCT: 44.3 % (ref 39.0–52.0)
Hemoglobin: 14.9 g/dL (ref 13.0–17.0)
Immature Granulocytes: 0 %
Lymphocytes Relative: 52 %
Lymphs Abs: 3.4 10*3/uL (ref 0.7–4.0)
MCH: 30.1 pg (ref 26.0–34.0)
MCHC: 33.6 g/dL (ref 30.0–36.0)
MCV: 89.5 fL (ref 80.0–100.0)
Monocytes Absolute: 0.5 10*3/uL (ref 0.1–1.0)
Monocytes Relative: 7 %
Neutro Abs: 2.3 10*3/uL (ref 1.7–7.7)
Neutrophils Relative %: 35 %
Platelets: 306 10*3/uL (ref 150–400)
RBC: 4.95 MIL/uL (ref 4.22–5.81)
RDW: 12.1 % (ref 11.5–15.5)
WBC: 6.6 10*3/uL (ref 4.0–10.5)
nRBC: 0 % (ref 0.0–0.2)

## 2019-06-14 LAB — COMPREHENSIVE METABOLIC PANEL
ALT: 24 U/L (ref 0–44)
AST: 19 U/L (ref 15–41)
Albumin: 4.3 g/dL (ref 3.5–5.0)
Alkaline Phosphatase: 83 U/L (ref 38–126)
Anion gap: 11 (ref 5–15)
BUN: 11 mg/dL (ref 6–20)
CO2: 22 mmol/L (ref 22–32)
Calcium: 9.4 mg/dL (ref 8.9–10.3)
Chloride: 111 mmol/L (ref 98–111)
Creatinine, Ser: 0.83 mg/dL (ref 0.61–1.24)
GFR calc Af Amer: >60 mL/min (ref >60)
GFR calc non Af Amer: >60 mL/min (ref >60)
Glucose, Bld: 99 mg/dL (ref 70–99)
Potassium: 3.2 mmol/L — ABNORMAL LOW (ref 3.5–5.1)
Sodium: 144 mmol/L (ref 135–145)
Total Bilirubin: 0.8 mg/dL (ref 0.3–1.2)
Total Protein: 7.1 g/dL (ref 6.5–8.1)

## 2019-06-14 NOTE — ED Triage Notes (Signed)
Patient with shortness of breath for the last three days.  States that it gets when he drinks.  He drank some beers, a 5th of whiskey and some vodka.  Patient does have a seizure disorder and does not take any meds.  Patient also having left testicular pain.

## 2019-06-14 NOTE — ED Notes (Signed)
Patient transported to Ultrasound 

## 2019-06-14 NOTE — Discharge Instructions (Addendum)
Your ultrasound  today shows a right epididymal cyst. This typically do not require treatment, but you should follow up with urology for further evaluation

## 2019-06-14 NOTE — ED Provider Notes (Signed)
Patient received in signout from S. Upstill PA-C with plan to follow up on scrotum US results. Please see her note for history and full HPI. Rest of work up including labs and chest xray are unremarkable.   Pt updated about results of scrotum US which shows right epididymal cyst without evidence of epididymitis or orchitis. No evidence of testicular mass.. Recommend pt to follow up outpatient with urology.   Evaluation does not show pathology that would require ongoing emergent intervention or inpatient treatment. I explained the diagnosis to the patient. Patient is comfortable with above plan and is stable for discharge at this time. All questions were answered prior to disposition. Strict return precautions for returning to the ED were discussed. Pt stable to be discharged home     ED Course/Procedures    Results for orders placed or performed during the hospital encounter of 06/14/19 (from the past 24 hour(s))  CBC with Differential     Status: None   Collection Time: 06/14/19  3:55 AM  Result Value Ref Range   WBC 6.6 4.0 - 10.5 K/uL   RBC 4.95 4.22 - 5.81 MIL/uL   Hemoglobin 14.9 13.0 - 17.0 g/dL   HCT 44.3 39.0 - 52.0 %   MCV 89.5 80.0 - 100.0 fL   MCH 30.1 26.0 - 34.0 pg   MCHC 33.6 30.0 - 36.0 g/dL   RDW 12.1 11.5 - 15.5 %   Platelets 306 150 - 400 K/uL   nRBC 0.0 0.0 - 0.2 %   Neutrophils Relative % 35 %   Neutro Abs 2.3 1.7 - 7.7 K/uL   Lymphocytes Relative 52 %   Lymphs Abs 3.4 0.7 - 4.0 K/uL   Monocytes Relative 7 %   Monocytes Absolute 0.5 0.1 - 1.0 K/uL   Eosinophils Relative 5 %   Eosinophils Absolute 0.4 0.0 - 0.5 K/uL   Basophils Relative 1 %   Basophils Absolute 0.0 0.0 - 0.1 K/uL   Immature Granulocytes 0 %   Abs Immature Granulocytes 0.01 0.00 - 0.07 K/uL  Comprehensive metabolic panel     Status: Abnormal   Collection Time: 06/14/19  3:55 AM  Result Value Ref Range   Sodium 144 135 - 145 mmol/L   Potassium 3.2 (L) 3.5 - 5.1 mmol/L   Chloride 111 98 - 111  mmol/L   CO2 22 22 - 32 mmol/L   Glucose, Bld 99 70 - 99 mg/dL   BUN 11 6 - 20 mg/dL   Creatinine, Ser 0.83 0.61 - 1.24 mg/dL   Calcium 9.4 8.9 - 10.3 mg/dL   Total Protein 7.1 6.5 - 8.1 g/dL   Albumin 4.3 3.5 - 5.0 g/dL   AST 19 15 - 41 U/L   ALT 24 0 - 44 U/L   Alkaline Phosphatase 83 38 - 126 U/L   Total Bilirubin 0.8 0.3 - 1.2 mg/dL   GFR calc non Af Amer >60 >60 mL/min   GFR calc Af Amer >60 >60 mL/min   Anion gap 11 5 - 15   SCROTAL ULTRASOUND    DOPPLER ULTRASOUND OF THE TESTICLES    TECHNIQUE:  Complete ultrasound examination of the testicles, epididymis, and  other scrotal structures was performed. Color and spectral Doppler  ultrasound were also utilized to evaluate blood flow to the  testicles.    COMPARISON: None.    FINDINGS:  Right testicle    Measurements: 2.5 by 1.9 by 4.3 cm. No hyperemia. No focal  testicular mass.  Left testicle    Measurements: 2.9 by 4.0 x 2.2 cm. No hyperemia. No focal testicular  mass.    Right epididymis: Right epididymal cyst measuring 6 mm. Otherwise  normal epididymis.    Left epididymis: Normal in size and appearance.    Hydrocele: None visualized.    Varicocele: Small bilateral varicoceles    Pulsed Doppler interrogation of both testes demonstrates normal low  resistance arterial and venous waveforms bilaterally.    IMPRESSION:  There is a right epididymal cyst measuring 6-7 mm in size  corresponding to the palpable abnormality. No evidence of  epididymitis or orchitis. No evidence of testicular mass. Small  bilateral varicoceles.      Electronically Signed  By: Paulina FusiMark Shogry M.D.  On: 06/14/2019 07:55   CHEST - 2 VIEW    COMPARISON: 02/07/2019    FINDINGS:  The heart size and mediastinal contours are within normal limits.  Both lungs are clear. The visualized skeletal structures are  unremarkable.    IMPRESSION:  No active cardiopulmonary disease.      Electronically  Signed  By: Jasmine PangKim Fujinaga M.D.  On: 06/14/2019 04:03        Vitals:   06/14/19 0830 06/14/19 0845  BP: 107/67 110/66  Pulse: 62 74  Resp:    Temp:    SpO2: 96% 98%      Sherene Sireslbrizze, Kaitlyn E, PA-C 06/14/19 [id number]    Tegeler, Canary Brimhristopher J, MD 06/14/19 [phone]

## 2019-06-14 NOTE — ED Provider Notes (Signed)
[facility] EMERGENCY [facility]PARTMENT Provider Note   CSN: [id number] Arrival date & time: 06/14/19  [id number]     History   Chief Complaint Chief Complaint  Patient presents with  . Shortness of Breath    HPI Johnny RoyalsBrandon Masaki is a 23 y.o. male.     Patient to ED with symptoms of shortness of breath and cough for months. No fever or chest pain. He is a smoker. No change in symptoms tonight. He also complains of pain in his scrotum and concern for a palpable mass for greater than one year. He reports urination and ejaculation are "sometimes" painful. No swelling of scrotum.   The history is provided by the patient. No language interpreter was used.  Shortness of Breath Associated symptoms: cough   Associated symptoms: no chest pain, no fever and no vomiting     Past Medical History:  Diagnosis Date  . Seizures (HCC)     There are no active problems to display for this patient.   No past surgical history on file.      Home Medications    Prior to Admission medications   Medication Sig Start Date End Date Taking? Authorizing Provider  albuterol (PROVENTIL HFA;VENTOLIN HFA) 108 (90 BASE) MCG/ACT inhaler Inhale 1-2 puffs into the lungs every 6 (six) hours as needed for wheezing or shortness of breath. 11/25/14   Shirleen Schirmerllis, Courtney, PA-C  benzonatate (TESSALON) 100 MG capsule Take 1 capsule (100 mg total) by mouth every 8 (eight) hours. Patient not taking: Reported on 02/07/2019 11/25/14   Shirleen Schirmerllis, Courtney, PA-C  Cetirizine HCl 10 MG CAPS Take 1 capsule (10 mg total) by mouth at bedtime. Patient not taking: Reported on 02/07/2019 11/25/14   Shirleen Schirmerllis, Courtney, PA-C  fluticasone [facility](FLONASE) 50 MCG/ACT nasal spray Place 2 sprays into both nostrils daily. Patient not taking: Reported on 02/07/2019 11/25/14   Shirleen Schirmerllis, Courtney, PA-C  ondansetron (ZOFRAN) 4 MG tablet Take 1 tablet (4 mg total) by mouth every 6 (six) hours. Patient not taking: Reported on 02/07/2019 11/25/14   Shirleen Schirmerllis,  Courtney, PA-C    Family History No family history on file.  Social History Social History   Tobacco Use  . Smoking status: Current Every Day Smoker    Packs/day: 0.50    Types: Cigarettes  Substance Use Topics  . Alcohol use: Yes  . Drug use: No     Allergies   Patient has no known allergies.   Review of Systems Review of Systems  Constitutional: Negative for fever.  HENT: Negative for congestion.   Respiratory: Positive for cough and shortness of breath.   Cardiovascular: Negative for chest pain.  Gastrointestinal: Negative for nausea and vomiting.  Genitourinary: Positive for testicular pain.     Physical Exam Updated Vital Signs BP 115/70 (BP Location: Right Arm)   Pulse 61   Temp 97.9 F (36.6 C) (Oral)   Resp 14 Comment: Simultaneous filing. User may not have seen previous data.  SpO2 100%   Physical Exam Vitals signs and nursing note reviewed.  Constitutional:      Appearance: He is well-developed.  HENT:     Head: Normocephalic.  Neck:     Musculoskeletal: Normal range of motion and neck supple.  Cardiovascular:     Rate and Rhythm: Normal rate and regular rhythm.  Pulmonary:     Effort: Pulmonary effort is normal.     Breath sounds: Normal breath sounds.  Abdominal:     General: Bowel sounds are normal.  Palpations: Abdomen is soft.     Tenderness: There is no abdominal tenderness. There is no guarding or rebound.  Genitourinary:    Comments: No scrotal swelling. No testicular mass. Circumcised. No inguinal lymphadenopathy.  Musculoskeletal: Normal range of motion.  Skin:    General: Skin is warm and dry.  Neurological:     Mental Status: He is alert.      ED Treatments / Results  Labs (all labs ordered are listed, but only abnormal results are displayed) Labs Reviewed  COMPREHENSIVE METABOLIC PANEL - Abnormal; Notable for the following components:      Result Value   Potassium 3.2 (*)    All other components within normal  limits  CBC WITH DIFFERENTIAL/PLATELET  URINALYSIS, ROUTINE W REFLEX MICROSCOPIC    EKG None  Radiology Dg Chest 2 View  Result Date: 06/14/2019 CLINICAL DATA:  Shortness of breath EXAM: CHEST - 2 VIEW COMPARISON:  02/07/2019 FINDINGS: The heart size and mediastinal contours are within normal limits. Both lungs are clear. The visualized skeletal structures are unremarkable. IMPRESSION: No active cardiopulmonary disease. Electronically Signed   By: Donavan Foil M.D.   On: 06/14/2019 04:03    Procedures Procedures (including critical care time)  Medications Ordered in ED Medications - No data to display   Initial Impression / Assessment and Plan / ED Course  I have reviewed the triage vital signs and the nursing notes.  Pertinent labs & imaging results that were available during my care of the patient were reviewed by me and considered in my medical decision making (see chart for details).        Patient to ED with cough that has been ongoing for months without recent change. He is a smoker. No fever, chest pain, SOB, wheezing.   He also complains of testicular pain and mass, also for months, estimates more than 1 year. No new symptoms or change in pain tonight. No fever, difficulty urinating or sexual activity.   VSS. Labs, CXR reassuring. No acute findings. US scrotum ordered and is pending at time of sign out to [employer], PA-C, who will discharge after review of results.   Final Clinical Impressions(s) / ED Diagnoses   Final diagnoses:  None   1. Chronic cough 2. Tobacco abuse 3. Testicular pain  ED Discharge Orders    None       Charlann Lange, PA-C 06/14/19 2250    Merryl Hacker, MD 06/15/19 [phone]

## 2019-06-23 NOTE — Progress Notes (Deleted)
Patient ID: Johnny [name], male   DOB: 1996-07-11, 23 y.o.   MRN: [id number]   Virtual Visit via Telephone Note  I connected with Johnny [name] on 06/24/19 at  3:50 PM EDT by telephone and verified that I am speaking with the correct person using two identifiers.   I discussed the limitations, risks, security and privacy concerns of performing an evaluation and management service by telephone and the availability of in person appointments. I also discussed with the patient that there may be a patient responsible charge related to this service. The patient expressed understanding and agreed to proceed.  Patient location: My Location:  [place] office Persons on the call:    History of Present Illness: Seen in ED 06/14/2019 for testicular pain and cough.     Observations/Objective:   Assessment and Plan:   Follow Up Instructions:    I discussed the assessment and treatment plan with the patient. The patient was provided an opportunity to ask questions and all were answered. The patient agreed with the plan and demonstrated an understanding of the instructions.   The patient was advised to call back or seek an in-person evaluation if the symptoms worsen or if the condition fails to improve as anticipated.  I provided *** minutes of non-face-to-face time during this encounter.   Freeman Caldron, PA-C

## 2019-06-24 ENCOUNTER — Emergency Department (HOSPITAL_COMMUNITY)
Admission: EM | Admit: 2019-06-24 | Discharge: 2019-06-25 | Disposition: A | Discharge status: Home / Self Care | Payer: Self-pay | Attending: Emergency Medicine | Admitting: Emergency Medicine

## 2019-06-24 ENCOUNTER — Other Ambulatory Visit: Payer: Self-pay

## 2019-06-24 ENCOUNTER — Ambulatory Visit: Payer: Self-pay | Attending: Family Medicine

## 2019-06-24 ENCOUNTER — Encounter (HOSPITAL_COMMUNITY): Payer: Self-pay | Admitting: Emergency Medicine

## 2019-06-24 DIAGNOSIS — Z5321 Procedure and treatment not carried out due to patient leaving prior to being seen by health care provider: Secondary | ICD-10-CM | POA: Insufficient documentation

## 2019-06-24 LAB — CBC WITH DIFFERENTIAL/PLATELET
Abs Immature Granulocytes: 0.02 10*3/uL (ref 0.00–0.07)
Basophils Absolute: 0 10*3/uL (ref 0.0–0.1)
Basophils Relative: 1 %
Eosinophils Absolute: 0.4 10*3/uL (ref 0.0–0.5)
Eosinophils Relative: 5 %
HCT: 47 % (ref 39.0–52.0)
Hemoglobin: 16.2 g/dL (ref 13.0–17.0)
Immature Granulocytes: 0 %
Lymphocytes Relative: 44 %
Lymphs Abs: 3.4 10*3/uL (ref 0.7–4.0)
MCH: 30.2 pg (ref 26.0–34.0)
MCHC: 34.5 g/dL (ref 30.0–36.0)
MCV: 87.5 fL (ref 80.0–100.0)
Monocytes Absolute: 0.4 10*3/uL (ref 0.1–1.0)
Monocytes Relative: 5 %
Neutro Abs: 3.5 10*3/uL (ref 1.7–7.7)
Neutrophils Relative %: 45 %
Platelets: 356 10*3/uL (ref 150–400)
RBC: 5.37 MIL/uL (ref 4.22–5.81)
RDW: 11.9 % (ref 11.5–15.5)
WBC: 7.7 10*3/uL (ref 4.0–10.5)
nRBC: 0 % (ref 0.0–0.2)

## 2019-06-24 LAB — BASIC METABOLIC PANEL
Anion gap: 12 (ref 5–15)
BUN: 12 mg/dL (ref 6–20)
CO2: 20 mmol/L — ABNORMAL LOW (ref 22–32)
Calcium: 9.7 mg/dL (ref 8.9–10.3)
Chloride: 108 mmol/L (ref 98–111)
Creatinine, Ser: 0.83 mg/dL (ref 0.61–1.24)
GFR calc Af Amer: >60 mL/min (ref >60)
GFR calc non Af Amer: >60 mL/min (ref >60)
Glucose, Bld: 91 mg/dL (ref 70–99)
Potassium: 3.6 mmol/L (ref 3.5–5.1)
Sodium: 140 mmol/L (ref 135–145)

## 2019-06-24 LAB — URINALYSIS, ROUTINE W REFLEX MICROSCOPIC
Bilirubin Urine: NEGATIVE
Glucose, UA: NEGATIVE mg/dL
Hgb urine dipstick: NEGATIVE
Ketones, ur: NEGATIVE mg/dL
Leukocytes,Ua: NEGATIVE
Nitrite: NEGATIVE
Protein, ur: NEGATIVE mg/dL
Specific Gravity, Urine: 1.002 — ABNORMAL LOW (ref 1.005–1.030)
pH: 6 (ref 5.0–8.0)

## 2019-06-24 LAB — ETHANOL: Alcohol, Ethyl (B): 127 mg/dL — ABNORMAL HIGH (ref <10)

## 2019-06-24 NOTE — ED Triage Notes (Signed)
Patient reports chronic left testicular pain after ejaculation for several months , pt. added ETOH intake today and fatigue.

## 2019-06-25 NOTE — ED Notes (Signed)
Called for pt x3 for vitals. No answer.  

## 2019-07-01 NOTE — Progress Notes (Signed)
Patient ID: Johnny [name], male   DOB: 1996-07-03, 23 y.o.   MRN: [id number]   Virtual Visit via Telephone Note  I connected with Johnny [name] on 07/02/19 at 10:30 AM EDT by telephone and verified that I am speaking with the correct person using two identifiers.   I discussed the limitations, risks, security and privacy concerns of performing an evaluation and management service by telephone and the availability of in person appointments. I also discussed with the patient that there may be a patient responsible charge related to this service. The patient expressed understanding and agreed to proceed.  Patient location: My Location:  [place] office Persons on the call:   History of Present Illness: After being seen in the ED 06/14/2019 for cough and testicular pain.  Returned to ED 9/9 and left without being seen-+alcohol, normal BMP/CBC.  From ED note 06/14/2019:  Patient to ED with cough that has been ongoing for months without recent change. He is a smoker. No fever, chest pain, SOB, wheezing.   He also complains of testicular pain and mass, also for months, estimates more than 1 year. No new symptoms or change in pain tonight. No fever, difficulty urinating or sexual activity.   VSS. Labs, CXR reassuring. No acute findings. US scrotum ordered   Testicular U/S: There is a right epididymal cyst measuring 6-7 mm in size corresponding to the palpable abnormality. No evidence of epididymitis or orchitis. No evidence of testicular mass. Small bilateral varicoceles    Observations/Objective:   Assessment and Plan:   Follow Up Instructions:    I discussed the assessment and treatment plan with the patient. The patient was provided an opportunity to ask questions and all were answered. The patient agreed with the plan and demonstrated an understanding of the instructions.   The patient was advised to call back or seek an in-person evaluation if the symptoms worsen or if the  condition fails to improve as anticipated.  I provided *** minutes of non-face-to-face time during this encounter.   Freeman Caldron, PA-C

## 2019-07-02 ENCOUNTER — Other Ambulatory Visit: Payer: Self-pay

## 2019-07-02 ENCOUNTER — Ambulatory Visit: Payer: Self-pay | Attending: Family Medicine | Admitting: Physician Assistant

## 2019-08-08 ENCOUNTER — Encounter (HOSPITAL_COMMUNITY): Payer: Self-pay | Admitting: Emergency Medicine

## 2019-08-08 ENCOUNTER — Other Ambulatory Visit: Payer: Self-pay

## 2019-08-08 ENCOUNTER — Emergency Department (HOSPITAL_COMMUNITY)
Admission: EM | Admit: 2019-08-08 | Discharge: 2019-08-09 | Disposition: A | Discharge status: Home / Self Care | Payer: Self-pay | Attending: Emergency Medicine | Admitting: Emergency Medicine

## 2019-08-08 DIAGNOSIS — Z5321 Procedure and treatment not carried out due to patient leaving prior to being seen by health care provider: Secondary | ICD-10-CM | POA: Insufficient documentation

## 2019-08-08 NOTE — ED Triage Notes (Signed)
C/o L groin pain x 2 months.  States he has been seen for same and told to go to [place] but they were only doing online visits and haven't called him back.

## 2019-08-09 NOTE — ED Notes (Signed)
No answer for room 

## 2019-08-15 ENCOUNTER — Emergency Department (HOSPITAL_COMMUNITY): Admission: EM | Admit: 2019-08-15 | Discharge: 2019-08-15 | Discharge status: Against Medical Advice | Payer: Self-pay

## 2019-09-24 IMAGING — US ULTRASOUND OF SCROTUM
1 series · 14 of 25 positions shown · non-contrast
Comparison: None.

CLINICAL DATA: Palpable area on the right with soreness for the
last 2 years.

EXAM:
SCROTAL ULTRASOUND
DOPPLER ULTRASOUND OF THE TESTICLES
TECHNIQUE: Complete ultrasound examination of the testicles, epididymis, and
other scrotal structures was performed. Color and spectral Doppler
ultrasound were also utilized to evaluate blood flow to the
testicles.

[Series 1: ultrasound of scrotum · 14 of 72 slices shown]
[im 1/72]
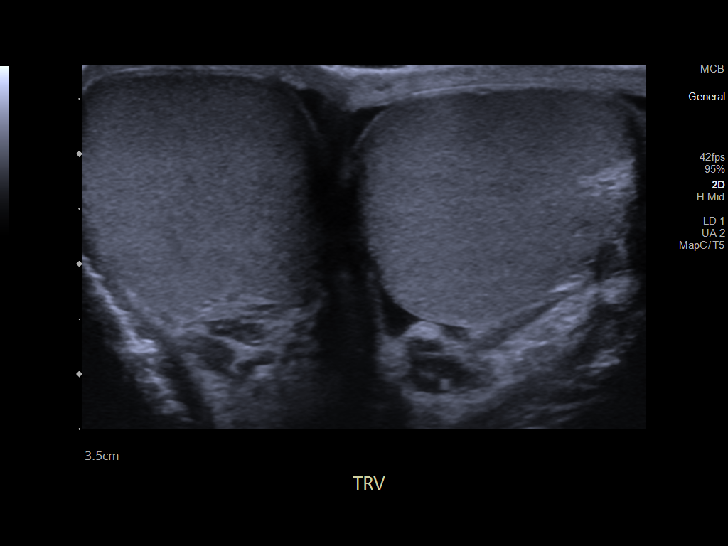
[im 6/72]
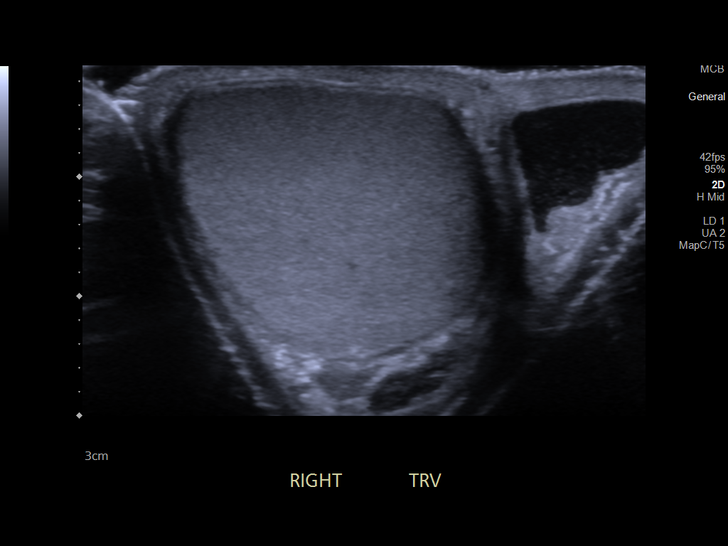
[im 12/72]
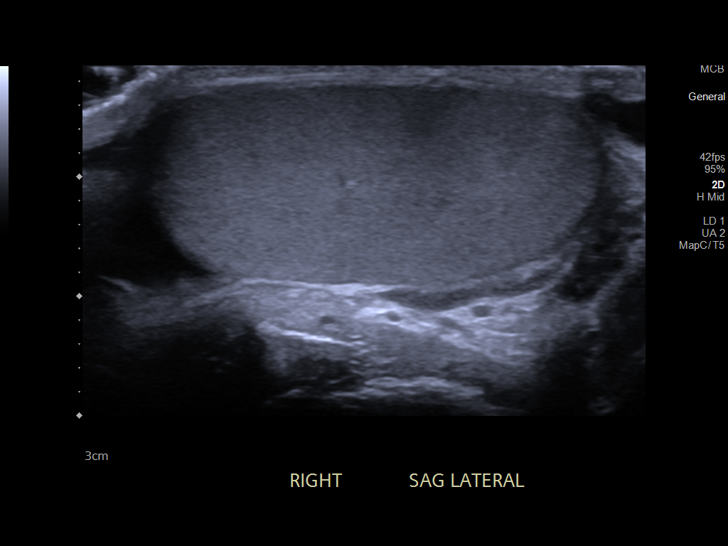
[im 18/72]
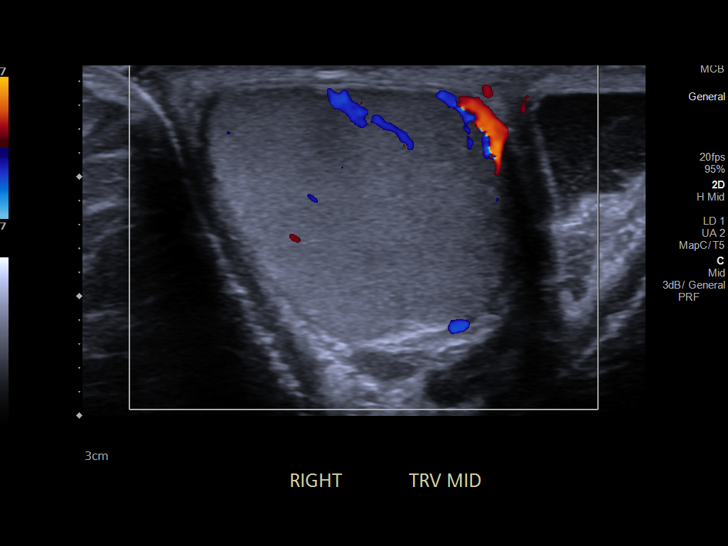
[im 24/72]
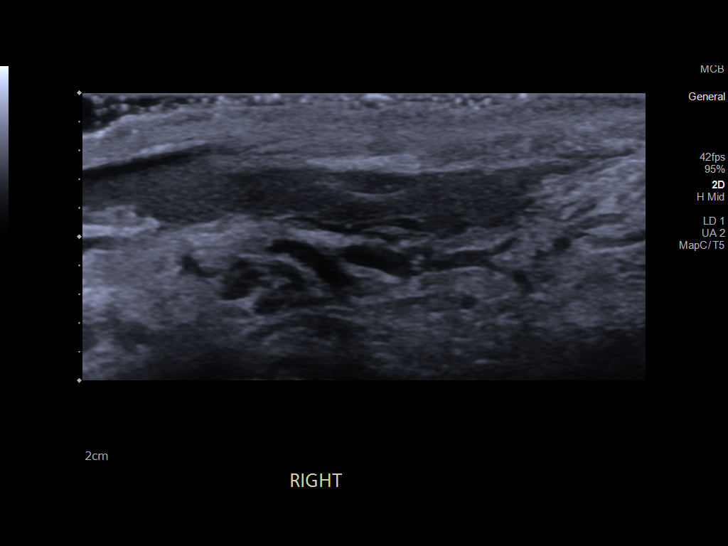
[im 27/72]
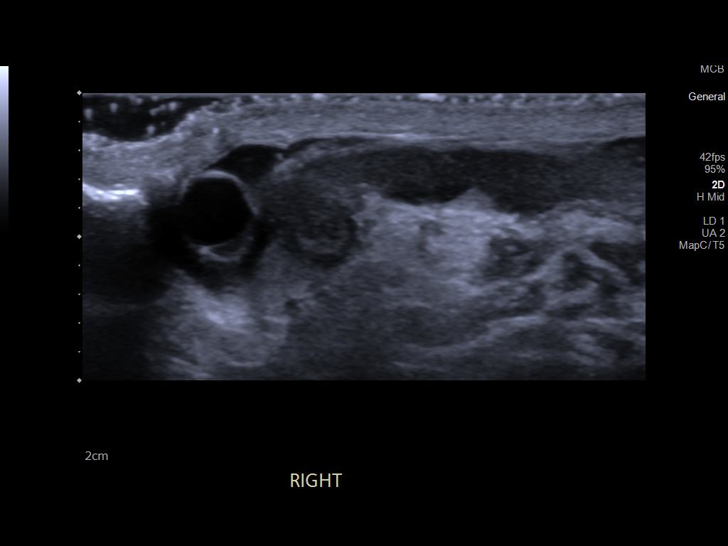
[im 33/72]
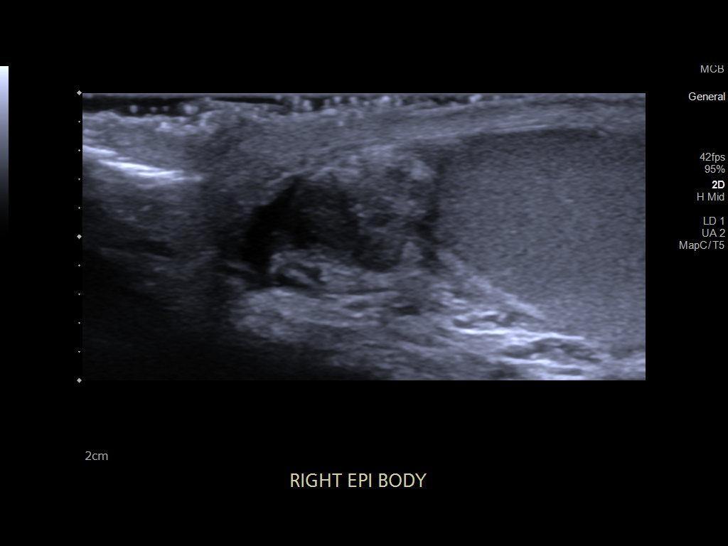
[im 39/72]
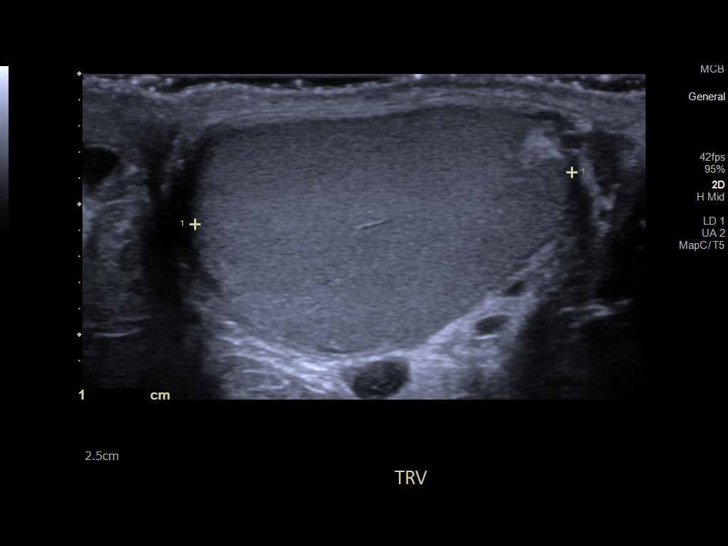
[im 45/72]
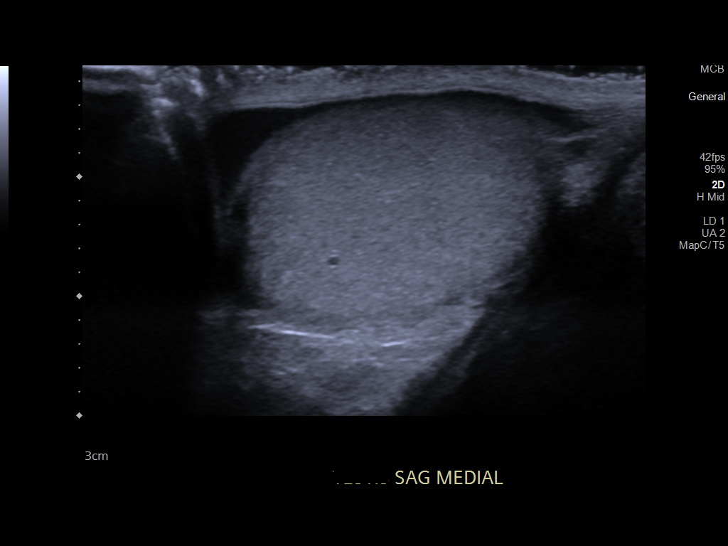
[im 48/72]
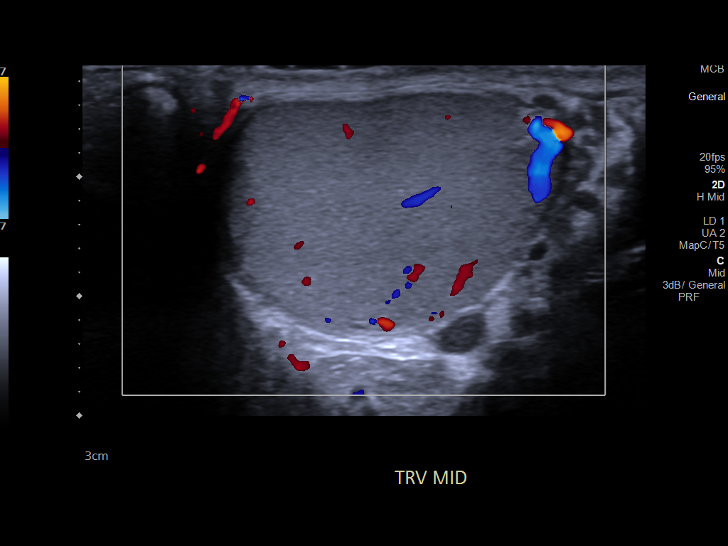
[im 54/72]
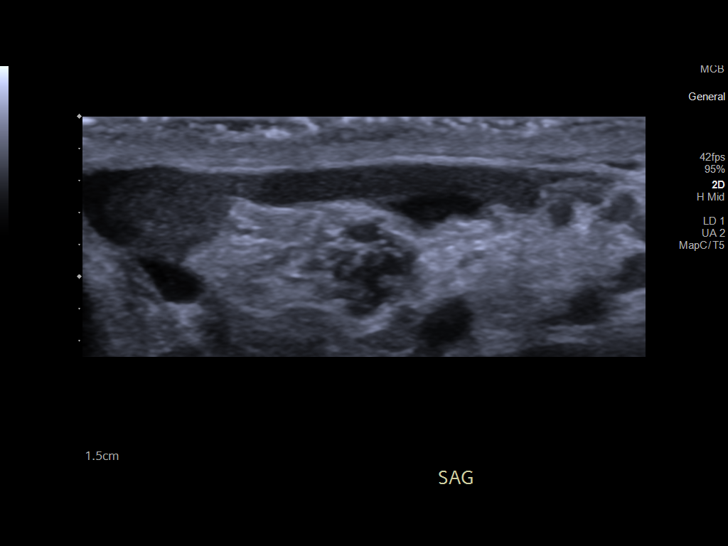
[im 60/72]
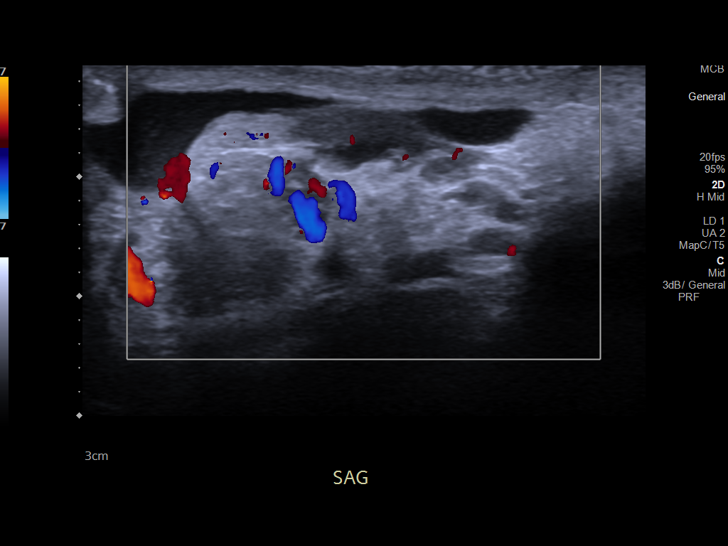
[im 66/72]
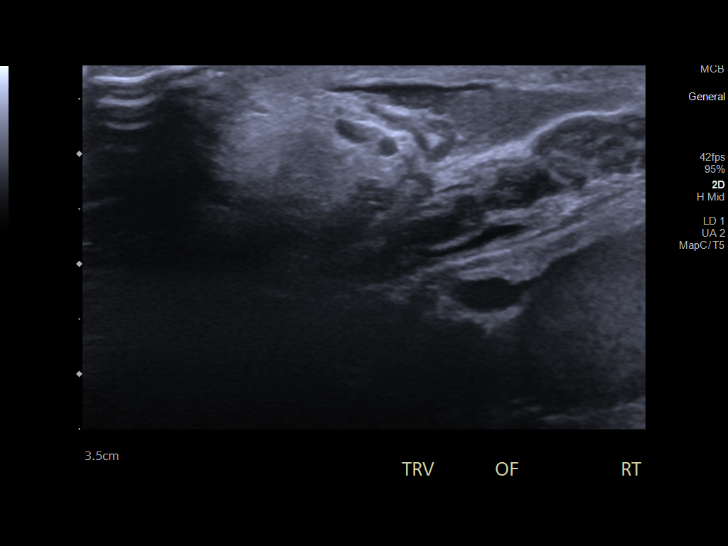
[im 72/72]
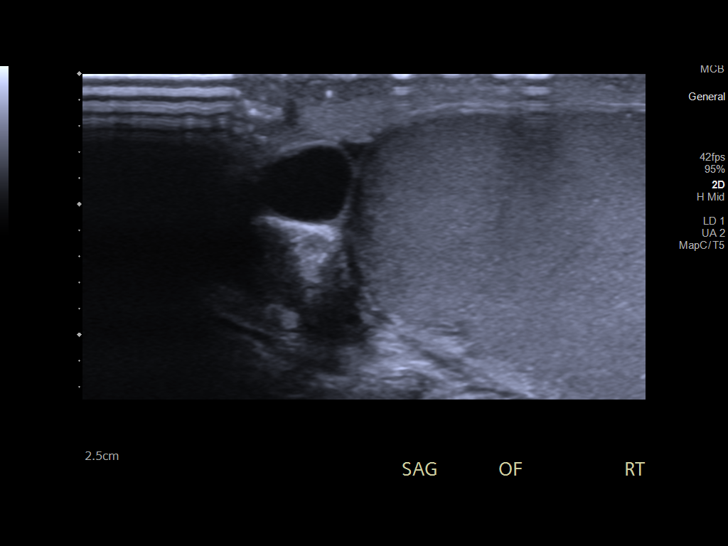

[14 of 25 positions shown; findings below may reference images not displayed]

FINDINGS: Right testicle

Measurements: 2.5 by 1.9 by 4.3 cm. No hyperemia. No focal
testicular mass.

Left testicle

Measurements: 2.9 by 4.0 x 2.2 cm. No hyperemia. No focal testicular
mass.

Right epididymis: Right epididymal cyst measuring 6 mm. Otherwise
normal epididymis.

Left epididymis:  Normal in size and appearance.

Hydrocele:  None visualized.

Varicocele:  Small bilateral varicoceles

Pulsed Doppler interrogation of both testes demonstrates normal low
resistance arterial and venous waveforms bilaterally.
IMPRESSION: There is a right epididymal cyst measuring 6-7 mm in size
corresponding to the palpable abnormality. No evidence of
epididymitis or orchitis. No evidence of testicular mass. Small
bilateral varicoceles.

## 2022-05-11 ENCOUNTER — Other Ambulatory Visit: Payer: Self-pay

## 2022-05-11 ENCOUNTER — Encounter (HOSPITAL_COMMUNITY): Payer: Self-pay

## 2022-05-11 ENCOUNTER — Emergency Department (HOSPITAL_COMMUNITY)
Admission: EM | Admit: 2022-05-11 | Discharge: 2022-05-11 | Disposition: A | Discharge status: Home / Self Care | Payer: Medicaid Other | Attending: Emergency Medicine | Admitting: Emergency Medicine

## 2022-05-11 ENCOUNTER — Emergency Department (HOSPITAL_COMMUNITY): Payer: Medicaid Other

## 2022-05-11 DIAGNOSIS — T40715A Adverse effect of cannabis, initial encounter: Secondary | ICD-10-CM | POA: Insufficient documentation

## 2022-05-11 DIAGNOSIS — R569 Unspecified convulsions: Secondary | ICD-10-CM | POA: Insufficient documentation

## 2022-05-11 DIAGNOSIS — R55 Syncope and collapse: Secondary | ICD-10-CM | POA: Insufficient documentation

## 2022-05-11 DIAGNOSIS — T50905A Adverse effect of unspecified drugs, medicaments and biological substances, initial encounter: Secondary | ICD-10-CM

## 2022-05-11 LAB — CBC WITH DIFFERENTIAL/PLATELET
Abs Immature Granulocytes: 0.05 10*3/uL (ref 0.00–0.07)
Basophils Absolute: 0 10*3/uL (ref 0.0–0.1)
Basophils Relative: 0 %
Eosinophils Absolute: 0.2 10*3/uL (ref 0.0–0.5)
Eosinophils Relative: 2 %
HCT: 43 % (ref 39.0–52.0)
Hemoglobin: 14.8 g/dL (ref 13.0–17.0)
Immature Granulocytes: 1 %
Lymphocytes Relative: 18 %
Lymphs Abs: 1.9 10*3/uL (ref 0.7–4.0)
MCH: 30.1 pg (ref 26.0–34.0)
MCHC: 34.4 g/dL (ref 30.0–36.0)
MCV: 87.4 fL (ref 80.0–100.0)
Monocytes Absolute: 0.6 10*3/uL (ref 0.1–1.0)
Monocytes Relative: 6 %
Neutro Abs: 7.7 10*3/uL (ref 1.7–7.7)
Neutrophils Relative %: 73 %
Platelets: 306 10*3/uL (ref 150–400)
RBC: 4.92 MIL/uL (ref 4.22–5.81)
RDW: 12 % (ref 11.5–15.5)
WBC: 10.5 10*3/uL (ref 4.0–10.5)
nRBC: 0 % (ref 0.0–0.2)

## 2022-05-11 LAB — RAPID URINE DRUG SCREEN, HOSP PERFORMED
Amphetamines: NOT DETECTED
Barbiturates: NOT DETECTED
Benzodiazepines: NOT DETECTED
Cocaine: NOT DETECTED
Opiates: NOT DETECTED
Tetrahydrocannabinol: POSITIVE — AB

## 2022-05-11 LAB — ETHANOL: Alcohol, Ethyl (B): <10 mg/dL (ref <10)

## 2022-05-11 LAB — COMPREHENSIVE METABOLIC PANEL
ALT: 18 U/L (ref 0–44)
AST: 16 U/L (ref 15–41)
Albumin: 4.1 g/dL (ref 3.5–5.0)
Alkaline Phosphatase: 94 U/L (ref 38–126)
Anion gap: 7 (ref 5–15)
BUN: 17 mg/dL (ref 6–20)
CO2: 24 mmol/L (ref 22–32)
Calcium: 9.5 mg/dL (ref 8.9–10.3)
Chloride: 108 mmol/L (ref 98–111)
Creatinine, Ser: 1.02 mg/dL (ref 0.61–1.24)
GFR, Estimated: >60 mL/min (ref >60)
Glucose, Bld: 85 mg/dL (ref 70–99)
Potassium: 4.1 mmol/L (ref 3.5–5.1)
Sodium: 139 mmol/L (ref 135–145)
Total Bilirubin: 0.4 mg/dL (ref 0.3–1.2)
Total Protein: 7.3 g/dL (ref 6.5–8.1)

## 2022-05-11 LAB — CBG MONITORING, ED: Glucose-Capillary: 156 mg/dL — ABNORMAL HIGH (ref 70–99)

## 2022-05-11 MED ORDER — LORAZEPAM 2 MG/ML IJ SOLN
1.0000 mg | Freq: Once | INTRAMUSCULAR | Status: AC
  Administered 2022-05-11: 1 mg via INTRAVENOUS
  Filled 2022-05-11: qty 1

## 2022-05-11 NOTE — ED Provider Notes (Signed)
Johnny [name]   CSN: [id number] Arrival date & time: 05/11/22  1813     History  Chief Complaint  Patient presents with   Seizures    Johnny [name] is a 26 y.o. male.   Seizures  Patient presents to the ED for evaluation after a seizure.  This was reported by friends lasting approximately 1 minute patient was out in a motel parking lot had just smoked substance that he thought was marijuana.  Patient had seizures as a child but none since that time.  He is not currently on any medications.  Patient right now denies any complaints of headache.  He does have some mild back discomfort but denies any serious injuries.  He is not having abdominal pain or chest discomfort.  He states he has some chronic testicular pain.  He was seen in the emergency room in the past and states he was given a referral.  Patient states he was not able to follow-up.  Patient has not noticed any acute changes or swelling but continues to have testicular pain    Home Medications Prior to Admission medications   Medication Sig Start Date End Date Taking? Authorizing Provider  albuterol (PROVENTIL HFA;VENTOLIN HFA) 108 (90 BASE) MCG/ACT inhaler Inhale 1-2 puffs into the lungs every 6 (six) hours as needed for wheezing or shortness of breath. 11/25/14   Shirleen Schirmer, PA-C  benzonatate (TESSALON) 100 MG capsule Take 1 capsule (100 mg total) by mouth every 8 (eight) hours. Patient not taking: Reported on 02/07/2019 11/25/14   Shirleen Schirmer, PA-C  Cetirizine HCl 10 MG CAPS Take 1 capsule (10 mg total) by mouth at bedtime. Patient not taking: Reported on 02/07/2019 11/25/14   Shirleen Schirmer, PA-C  fluticasone [facility]) 50 MCG/ACT nasal spray Place 2 sprays into both nostrils daily. Patient not taking: Reported on 02/07/2019 11/25/14   Shirleen Schirmer, PA-C  ondansetron (ZOFRAN) 4 MG tablet Take 1 tablet (4 mg total) by mouth every 6 (six) hours. Patient not taking:  Reported on 02/07/2019 11/25/14   Shirleen Schirmer, PA-C      Allergies    Patient has no known allergies.    Review of Systems   Review of Systems  Neurological:  Positive for seizures.    Physical Exam Updated Vital Signs BP 129/80   Pulse 72   Temp 98.7 F (37.1 C) (Oral)   Resp 15   Ht 1.93 m (6\' 4" )   Wt 108.9 kg   SpO2 96%   BMI 29.21 kg/m  Physical Exam Vitals and nursing [name] reviewed.  Constitutional:      Appearance: He is well-developed. He is not diaphoretic.  HENT:     Head: Normocephalic and atraumatic.     Right Ear: External ear normal.     Left Ear: External ear normal.  Eyes:     General: No scleral icterus.       Right eye: No discharge.        Left eye: No discharge.     Conjunctiva/sclera: Conjunctivae normal.  Neck:     Trachea: No tracheal deviation.  Cardiovascular:     Rate and Rhythm: Normal rate and regular rhythm.  Pulmonary:     Effort: Pulmonary effort is normal. No respiratory distress.     Breath sounds: Normal breath sounds. No stridor. No wheezing or rales.  Abdominal:     General: Bowel sounds are normal. There is no distension.     Palpations: Abdomen is soft.  Tenderness: There is no abdominal tenderness. There is no guarding or rebound.  Musculoskeletal:        General: No tenderness or deformity.     Cervical back: Neck supple.  Skin:    General: Skin is warm and dry.     Findings: No rash.  Neurological:     General: No focal deficit present.     Mental Status: He is alert and oriented to person, place, and time. Mental status is at baseline.     Cranial Nerves: No cranial nerve deficit (no facial droop, extraocular movements intact, no slurred speech).     Sensory: No sensory deficit.     Motor: No abnormal muscle tone or seizure activity.     Coordination: Coordination normal.  Psychiatric:        Mood and Affect: Mood normal.     ED Results / Procedures / Treatments   Labs (all labs ordered are listed, but  only abnormal results are displayed) Labs Reviewed  RAPID URINE DRUG SCREEN, HOSP PERFORMED - Abnormal; Notable for the following components:      Result Value   Tetrahydrocannabinol POSITIVE (*)    All other components within normal limits  CBG MONITORING, ED - Abnormal; Notable for the following components:   Glucose-Capillary 156 (*)    All other components within normal limits  COMPREHENSIVE METABOLIC PANEL  CBC WITH DIFFERENTIAL/PLATELET  ETHANOL    EKG None  Radiology CT Head Wo Contrast  Result Date: 05/11/2022 CLINICAL DATA:  Witnessed seizure EXAM: CT HEAD WITHOUT CONTRAST TECHNIQUE: Contiguous axial images were obtained from the base of the skull through the vertex without intravenous contrast. RADIATION DOSE REDUCTION: This exam was performed according to the departmental dose-optimization program which includes automated exposure control, adjustment of the mA and/or kV according to patient size and/or use of iterative reconstruction technique. COMPARISON:  None Available. FINDINGS: Brain: No acute infarct, hemorrhage, or mass lesion is present. No significant white matter lesions are present. The ventricles are of normal size. No significant extraaxial fluid collection is present. The brainstem and cerebellum are within normal limits. Vascular: No hyperdense vessel or unexpected calcification. Skull: Calvarium is intact. No focal lytic or blastic lesions are present. No significant extracranial soft tissue lesion is present. Sinuses/Orbits: The anterior right ethmoid air cells and right frontal sinuses are chronically opacified. The paranasal sinuses and mastoid air cells are otherwise clear. The globes and orbits are within normal limits. IMPRESSION: 1. Normal CT appearance of the brain. 2. 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 Electronically Signed   By: Marin Roberts M.D.   On: 05/11/2022 19:56    Procedures Procedures    Medications Ordered  in ED Medications  LORazepam (ATIVAN) injection 1 mg (1 mg Intravenous Given 05/11/22 1938)    ED Course/ Medical Decision Making/ A&P Clinical Course as of 05/11/22 2142  Fri May 11, 2022  2019 CT Head Wo Contrast Head CT without acute findings [JK]  2019 CBC with Differential/Platelet Normal [JK]  2020 Comprehensive metabolic panel Normal [JK]  2020 Ethanol Normal [JK]  2121 Rapid urine drug screen (hospital performed)(!) Positive for [facility] [JK]    Clinical Course User Index [JK] Linwood Dibbles, MD                           Medical Decision Making Problems Addressed: Adverse effect of drug, initial encounter: acute illness or injury that poses a threat to life or  bodily functions Seizure-like activity (HCC): acute illness or injury Syncope, unspecified syncope type: acute illness or injury  Amount and/or Complexity of Data Reviewed External Data Reviewed: radiology and notes.    Details: Previous visit notes reviewed.  In August 2020 patient had an ultrasound of the scrotum that showed an epididymal cyst.  No testicular mass noted Labs: ordered. Decision-making details documented in ED Course. Radiology: ordered. Decision-making details documented in ED Course.  Risk Prescription drug management.   Patient's wife was able to provide additional history.  Patient regularly smokes marijuana but he found this unknown substance on the ground and decided to smoke it because he was not able to afford marijuana.  Patient initially was fine but then she witnessed him become unresponsive foam at the mouth and had some shaking.  Unclear if this was a syncopal episode versus seizure.  EMS did report some postictal state.  Patient had multiple episodes of emesis.  Clearly this was a result of the unknown substance.  Patient's ED work-up is otherwise reassuring.  UDS was only positive performed on.  No acute findings noted on head CT.  Patient has returned to baseline.  He is alert and awake.  No  recurrent vomiting.  Will have patient follow-up with neurology consider outpatient EEG as he does have history of seizure disorder although this is most likely related to the acute intoxication.  Evaluation and diagnostic testing in the emergency department does not suggest an emergent condition requiring admission or immediate intervention beyond what has been performed at this time.  The patient is safe for discharge and has been instructed to return immediately for worsening symptoms, change in symptoms or any other concerns.         Final Clinical Impression(s) / ED Diagnoses Final diagnoses:  Seizure-like activity (HCC)  Syncope, unspecified syncope type  Adverse effect of drug, initial encounter    Rx / DC Orders ED Discharge Orders          Ordered    Ambulatory referral to Neurology       Comments: An appointment is requested in approximately: 2 weeks   05/11/22 2139              Linwood Dibbles, MD 05/11/22 2142

## 2022-05-11 NOTE — ED Triage Notes (Signed)
Pt coming from a motel parking lot via EMS with c/o a seizure. Witnessed by friends, estimated not longer than 1 minute. Postical when fire arrived, multiple episodes of emesis. Patient had no n/v with EMS. Pt reportedly found a small baggy with unknown contents and smoked it, hoping it would help his testicular pain. After pt smoked unknown substance he had a seizure.  Hx seizures, last seizure when 26 yrs old. Not taking any seizure medications.

## 2022-05-11 NOTE — Discharge Instructions (Addendum)
Avoid future drug use.  Follow-up with a neurologist to consider further evaluation.  Return to the ED for recurrent episodes
